# Patient Record
Sex: Male | Born: 1995 | Race: Black or African American | Hispanic: No | Marital: Single
Health system: Midwestern US, Community
[De-identification: ages and names within clinical notes are randomized; demographics above are authoritative.]

---

## 2021-03-02 ENCOUNTER — Encounter: Payer: Self-pay | Admitting: *Deleted

## 2021-03-02 NOTE — Congregational Nurse Program (Signed)
  Dept: [phone]   Congregational Nurse Program Note  Date of Encounter: 03/02/2021  Past Medical History: No past medical history on file.  Encounter Details:  CNP Questionnaire - 03/02/21 0930       Questionnaire   Do you give verbal consent to treat you today? Yes    Visit Setting Church or Organization    Location Patient Served At [facility]    Patient Status Not Applicable    Insurance Uninsured (Includes [employer])    Intervention Support;Refer;Educate    Referrals PCP - other provider             Client came in [facility] requesting a blood pressure check. BP 153/92 Pulse 58. Client reports his diet consists of mostly Ramen Noodles and decreased exercise since being out of school. Client is from [place] and has family there. He does have a brother in the area. Educated client on HTN and better food choices. Referred to Lavinia Sharps NP for a PCP. Discussed Mental Health Services in the area as client reports some depression with no si or hi. Stormy Connon W RN CN [phone]

## 2021-03-05 ENCOUNTER — Encounter: Payer: Self-pay | Admitting: *Deleted
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
## 2021-03-25 ENCOUNTER — Other Ambulatory Visit: Payer: Self-pay

## 2021-03-25 ENCOUNTER — Encounter (HOSPITAL_BASED_OUTPATIENT_CLINIC_OR_DEPARTMENT_OTHER): Payer: Self-pay | Admitting: *Deleted

## 2021-03-25 ENCOUNTER — Emergency Department (HOSPITAL_BASED_OUTPATIENT_CLINIC_OR_DEPARTMENT_OTHER)
Admission: EM | Admit: 2021-03-25 | Discharge: 2021-03-26 | Disposition: A | Discharge status: Home / Self Care | Payer: Self-pay | Attending: Emergency Medicine | Admitting: Emergency Medicine

## 2021-03-25 ENCOUNTER — Emergency Department (HOSPITAL_BASED_OUTPATIENT_CLINIC_OR_DEPARTMENT_OTHER): Payer: Self-pay

## 2021-03-25 DIAGNOSIS — F17210 Nicotine dependence, cigarettes, uncomplicated: Secondary | ICD-10-CM | POA: Insufficient documentation

## 2021-03-25 DIAGNOSIS — M545 Low back pain, unspecified: Secondary | ICD-10-CM | POA: Insufficient documentation

## 2021-03-25 DIAGNOSIS — W010XXA Fall on same level from slipping, tripping and stumbling without subsequent striking against object, initial encounter: Secondary | ICD-10-CM | POA: Insufficient documentation

## 2021-03-25 DIAGNOSIS — W19XXXA Unspecified fall, initial encounter: Secondary | ICD-10-CM

## 2021-03-25 MED ORDER — NAPROXEN 250 MG PO TABS
375.0000 mg | ORAL_TABLET | Freq: Once | ORAL | Status: AC
  Administered 2021-03-25: 375 mg via ORAL
  Filled 2021-03-25: qty 2

## 2021-03-25 MED ORDER — ACETAMINOPHEN 500 MG PO TABS
1000.0000 mg | ORAL_TABLET | Freq: Once | ORAL | Status: AC
  Administered 2021-03-25: 1000 mg via ORAL
  Filled 2021-03-25: qty 2

## 2021-03-25 NOTE — ED Triage Notes (Signed)
Pt brought in by EMS from [facility]. Reports  a fall. Ambulatory on scene

## 2021-03-25 NOTE — ED Provider Notes (Signed)
[facility] EMERGENCY DEPARTMENT Provider Note  CSN: [id number] Arrival date & time: 03/25/21 2250  Chief Complaint(s) Fall  HPI Darrell Kelly is a 25 y.o. male here for lower back pain following a mechanical fall at 10 PM this evening.  Patient reports slipping on wet ground causing him to fall onto his buttocks.  Patient had immediate back pain located in the paraspinal musculature in the lumbar region.  Pain worse with movement, twisting and palpation of this region.  Alleviated by mobility.  Patient has been ambulatory since the incident.  Denies any lower extremity weakness or loss of sensation.  No bladder/bowel incontinence.  No other injuries related to the fall.   Fall   Past Medical History History reviewed. No pertinent past medical history. There are no problems to display for this patient.  Home Medication(s) Prior to Admission medications   Medication Sig Start Date End Date Taking? Authorizing Provider  cyclobenzaprine (FLEXERIL) 10 MG tablet Take 1 tablet (10 mg total) by mouth at bedtime for 10 days. 03/26/21 04/05/21 Yes Josaiah Muhammed, Amadeo Garnet, MD  naproxen (NAPROSYN) 375 MG tablet Take 1 tablet (375 mg total) by mouth 2 (two) times daily. 03/26/21  Yes Zayvon Alicea, Amadeo Garnet, MD                                                                                                                                    Past Surgical History History reviewed. No pertinent surgical history. Family History No family history on file.  Social History Social History   Tobacco Use   Smoking status: Some Days    Pack years: 0.00    Types: Cigarettes    Passive exposure: Never   Smokeless tobacco: Never  Vaping Use   Vaping Use: Every day  Substance Use Topics   Alcohol use: Never   Drug use: Never   Allergies Patient has no known allergies.  Review of Systems Review of Systems All other systems are reviewed and are negative for acute change except as noted in the  HPI  Physical Exam Vital Signs  I have reviewed the triage vital signs BP (!) 146/84 (BP Location: Right Arm)   Pulse 60   Temp 98.4 F (36.9 C) (Oral)   Resp 20   Ht 5\' 9"  (1.753 m)   Wt 96.3 kg   SpO2 99%   BMI 31.35 kg/m   Physical Exam Vitals reviewed.  Constitutional:      General: He is not in acute distress.    Appearance: He is well-developed. He is not diaphoretic.  HENT:     Head: Normocephalic and atraumatic.     Right Ear: External ear normal.     Left Ear: External ear normal.     Nose: Nose normal.     Mouth/Throat:     Mouth: Mucous membranes are moist.  Eyes:     General: No scleral icterus.  Conjunctiva/sclera: Conjunctivae normal.  Neck:     Trachea: Phonation normal.  Cardiovascular:     Rate and Rhythm: Normal rate and regular rhythm.  Pulmonary:     Effort: Pulmonary effort is normal. No respiratory distress.     Breath sounds: No stridor.  Abdominal:     General: There is no distension.  Musculoskeletal:        General: Normal range of motion.     Cervical back: Normal range of motion. No tenderness or bony tenderness.     Thoracic back: No tenderness or bony tenderness.     Lumbar back: Tenderness and bony tenderness present.       Back:  Neurological:     Mental Status: He is alert and oriented to person, place, and time.     Comments: Spine Exam: Strength: 5/5 throughout LE bilaterally (hip flexion/extension, adduction/abduction; knee flexion/extension; foot dorsiflexion/plantarflexion, inversion/eversion; great toe inversion) Sensation: Intact to light touch in proximal and distal LE bilaterally Reflexes: 1+ quadriceps and achilles reflexes   Psychiatric:        Behavior: Behavior normal.    ED Results and Treatments Labs (all labs ordered are listed, but only abnormal results are displayed) Labs Reviewed - No data to display                                                                                                                        EKG  EKG Interpretation  Date/Time:    Ventricular Rate:    PR Interval:    QRS Duration:   QT Interval:    QTC Calculation:   R Axis:     Text Interpretation:          Radiology DG Lumbar Spine 2-3 Views  Result Date: 03/26/2021 CLINICAL DATA:  Fall with low back pain EXAM: LUMBAR SPINE - 2-3 VIEW COMPARISON:  None. FINDINGS: There is no evidence of lumbar spine fracture. Alignment is normal. Intervertebral disc spaces are maintained. IMPRESSION: Negative. Electronically Signed   By: Deatra Robinson M.D.   On: 03/26/2021 00:43    Pertinent labs & imaging results that were available during my care of the patient were reviewed by me and considered in my medical decision making (see chart for details).  Medications Ordered in ED Medications  acetaminophen (TYLENOL) tablet 1,000 mg (1,000 mg Oral Given 03/25/21 2343)  naproxen (NAPROSYN) tablet 375 mg (375 mg Oral Given 03/25/21 2343)  Procedures Procedures  (including critical care time)  Medical Decision Making / ED Course I have reviewed the nursing notes for this encounter and the patient's prior records (if available in EHR or on provided paperwork).   Darrell Kelly was evaluated in Emergency Department on 03/26/2021 for the symptoms described in the history of present illness. He was evaluated in the context of the global COVID-19 pandemic, which necessitated consideration that the patient might be at risk for infection with the SARS-CoV-2 virus that causes COVID-19. Institutional protocols and algorithms that pertain to the evaluation of patients at risk for COVID-19 are in a state of rapid change based on information released by regulatory bodies including the CDC and federal and state organizations. These policies and algorithms were followed during the patient's care in the ED.  25 y.o.  male presents with back pain in lumbar area for 2 hours without signs of radicular pain following mechanical fall. No red flag symptoms of  saddle anesthesia, weakness, fecal/urinary incontinence or urinary retention.   Plain film negative for fracture or malalignement.  Patient was recommended to take short course of scheduled NSAIDs and engage in early mobility as definitive treatment. Return precautions discussed for worsening or new concerning symptoms.      Final Clinical Impression(s) / ED Diagnoses Final diagnoses:  Fall, initial encounter  Lumbar back pain   The patient appears reasonably screened and/or stabilized for discharge and I doubt any other medical condition or other [facility] requiring further screening, evaluation, or treatment in the ED at this time prior to discharge. Safe for discharge with strict return precautions.  Disposition: Discharge  Condition: Good  I have discussed the results, Dx and Tx plan with the patient/family who expressed understanding and agree(s) with the plan. Discharge instructions discussed at length. The patient/family was given strict return precautions who verbalized understanding of the instructions. No further questions at time of discharge.    ED Discharge Orders          Ordered    naproxen (NAPROSYN) 375 MG tablet  2 times daily        03/26/21 0012    cyclobenzaprine (FLEXERIL) 10 MG tablet  Daily at bedtime        03/26/21 0012             Follow Up: Primary care provider  Call  to schedule an appointment for close follow up     This chart was dictated using voice recognition software.  Despite best efforts to proofread,  errors can occur which can change the documentation meaning.    Nira Conn, MD 03/26/21 [phone]

## 2021-03-26 MED ORDER — CYCLOBENZAPRINE HCL 10 MG PO TABS: 10.0000 mg | ORAL_TABLET | Freq: Every day | ORAL | 0 refills | Status: DC

## 2021-03-26 MED ORDER — NAPROXEN 375 MG PO TABS: 375.0000 mg | ORAL_TABLET | Freq: Two times a day (BID) | ORAL | 0 refills | Status: DC

## 2021-03-26 NOTE — Discharge Instructions (Addendum)
You may use over-the-counter Motrin (Ibuprofen), Acetaminophen (Tylenol), topical muscle creams such as SalonPas, Icy Hot, Bengay, etc. Please stretch, apply ice or heat (whichever helps), and have massage therapy for additional assistance.  

## 2021-03-28 ENCOUNTER — Emergency Department (HOSPITAL_COMMUNITY)
Admission: EM | Admit: 2021-03-28 | Discharge: 2021-03-28 | Disposition: A | Discharge status: Home / Self Care | Payer: Self-pay | Attending: Emergency Medicine | Admitting: Emergency Medicine

## 2021-03-28 ENCOUNTER — Emergency Department (HOSPITAL_COMMUNITY): Payer: Self-pay

## 2021-03-28 ENCOUNTER — Encounter (HOSPITAL_COMMUNITY): Payer: Self-pay | Admitting: Emergency Medicine

## 2021-03-28 ENCOUNTER — Other Ambulatory Visit: Payer: Self-pay

## 2021-03-28 DIAGNOSIS — M542 Cervicalgia: Secondary | ICD-10-CM | POA: Insufficient documentation

## 2021-03-28 DIAGNOSIS — F17210 Nicotine dependence, cigarettes, uncomplicated: Secondary | ICD-10-CM | POA: Insufficient documentation

## 2021-03-28 MED ORDER — METHOCARBAMOL 500 MG PO TABS: 500.0000 mg | ORAL_TABLET | Freq: Two times a day (BID) | ORAL | 0 refills | Status: DC
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
## 2021-03-28 NOTE — Discharge Instructions (Addendum)
You will need to follow up with spine specialist, their contact information is attached to your chart.   You may take NSAIDS to help with pain control but please make sure to take these with food.   If you experience any changes in vision, headaches, you will need to return to the ED.

## 2021-03-28 NOTE — ED Notes (Signed)
Patient called for room placement x1 with no answer. 

## 2021-03-30 ENCOUNTER — Emergency Department (HOSPITAL_COMMUNITY)
Admission: EM | Admit: 2021-03-30 | Discharge: 2021-03-30 | Disposition: A | Discharge status: Home / Self Care | Payer: Self-pay | Attending: Emergency Medicine | Admitting: Emergency Medicine

## 2021-03-30 ENCOUNTER — Encounter (HOSPITAL_COMMUNITY): Payer: Self-pay | Admitting: Emergency Medicine

## 2021-03-30 ENCOUNTER — Other Ambulatory Visit: Payer: Self-pay

## 2021-03-30 DIAGNOSIS — F17210 Nicotine dependence, cigarettes, uncomplicated: Secondary | ICD-10-CM | POA: Insufficient documentation

## 2021-03-30 DIAGNOSIS — M542 Cervicalgia: Secondary | ICD-10-CM | POA: Insufficient documentation

## 2021-03-30 MED ORDER — NAPROXEN 500 MG PO TABS: 500.0000 mg | ORAL_TABLET | Freq: Two times a day (BID) | ORAL | 0 refills | Status: AC

## 2021-03-30 MED ORDER — METHOCARBAMOL 500 MG PO TABS: 500.0000 mg | ORAL_TABLET | Freq: Two times a day (BID) | ORAL | 0 refills | Status: DC
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
## 2021-03-30 NOTE — ED Triage Notes (Addendum)
Pt c/o neck pain x 1.5 years. Vitals stable. Ambulatory in triage.

## 2021-03-30 NOTE — Discharge Instructions (Addendum)

## 2021-04-15 ENCOUNTER — Other Ambulatory Visit: Payer: Self-pay

## 2021-04-15 DIAGNOSIS — F17210 Nicotine dependence, cigarettes, uncomplicated: Secondary | ICD-10-CM | POA: Insufficient documentation

## 2021-04-15 DIAGNOSIS — M25512 Pain in left shoulder: Secondary | ICD-10-CM | POA: Insufficient documentation

## 2021-04-15 DIAGNOSIS — W19XXXA Unspecified fall, initial encounter: Secondary | ICD-10-CM | POA: Insufficient documentation

## 2021-04-16 ENCOUNTER — Emergency Department (HOSPITAL_BASED_OUTPATIENT_CLINIC_OR_DEPARTMENT_OTHER)
Admission: EM | Admit: 2021-04-16 | Discharge: 2021-04-16 | Disposition: A | Discharge status: Home / Self Care | Payer: Self-pay | Attending: Emergency Medicine | Admitting: Emergency Medicine

## 2021-04-16 ENCOUNTER — Emergency Department (HOSPITAL_BASED_OUTPATIENT_CLINIC_OR_DEPARTMENT_OTHER): Payer: Self-pay | Admitting: Radiology

## 2021-04-16 DIAGNOSIS — M25512 Pain in left shoulder: Secondary | ICD-10-CM

## 2021-04-16 MED ORDER — KETOROLAC TROMETHAMINE 30 MG/ML IJ SOLN
30.0000 mg | Freq: Once | INTRAMUSCULAR | Status: AC
  Administered 2021-04-16: 30 mg via INTRAMUSCULAR
  Filled 2021-04-16: qty 1

## 2021-04-16 MED ORDER — IBUPROFEN 600 MG PO TABS: 600.0000 mg | ORAL_TABLET | Freq: Four times a day (QID) | ORAL | 0 refills | Status: DC | PRN

## 2021-04-16 NOTE — ED Provider Notes (Signed)
[facility] EMERGENCY DEPT Provider Note   CSN: [id number] Arrival date & time: 04/15/21  2359     History Chief Complaint  Patient presents with   Shoulder Pain    Darrell Kelly is a 25 y.o. male.  HPI     This is a 25 year old male brought in by EMS with left shoulder pain.  Patient reports that he fell approximately 1 hour prior to arrival.  He reports tripping and falling.  He reports that he landed on his left side hurting his left shoulder.  He rates his pain at 7 out of 10.  He did not take anything for the pain.  Pain is worse with range of motion.  Denies hitting his head or loss of consciousness.  Denies other injury.  No past medical history on file.  There are no problems to display for this patient.   No past surgical history on file.     No family history on file.  Social History   Tobacco Use   Smoking status: Some Days    Types: Cigarettes    Passive exposure: Never   Smokeless tobacco: Never  Vaping Use   Vaping Use: Every day  Substance Use Topics   Alcohol use: Never   Drug use: Never    Home Medications Prior to Admission medications   Medication Sig Start Date End Date Taking? Authorizing Provider  ibuprofen (ADVIL) 600 MG tablet Take 1 tablet (600 mg total) by mouth every 6 (six) hours as needed. 04/16/21  Yes Antonia Jicha, Mayer Masker, MD  methocarbamol (ROBAXIN) 500 MG tablet Take 1 tablet (500 mg total) by mouth 2 (two) times daily. 03/30/21   Couture, Cortni S, PA-C    Allergies    Patient has no known allergies.  Review of Systems   Review of Systems  Musculoskeletal:        Shoulder pain  Neurological:  Negative for weakness and numbness.  All other systems reviewed and are negative.  Physical Exam Updated Vital Signs BP 126/83 (BP Location: Right Arm)   Pulse 72   Temp 98.8 F (37.1 C) (Oral)   Resp 18   Ht 1.702 m (5\' 7" )   Wt 90.7 kg   SpO2 100%   BMI 31.32 kg/m   Physical Exam Vitals and nursing note  reviewed.  Constitutional:      Appearance: He is well-developed. He is not ill-appearing.  HENT:     Head: Normocephalic and atraumatic.     Nose: Nose normal.     Mouth/Throat:     Mouth: Mucous membranes are moist.  Eyes:     Pupils: Pupils are equal, round, and reactive to light.  Cardiovascular:     Rate and Rhythm: Normal rate and regular rhythm.     Heart sounds: Normal heart sounds. No murmur heard. Pulmonary:     Effort: Pulmonary effort is normal. No respiratory distress.     Breath sounds: Normal breath sounds. No wheezing.  Abdominal:     General: Bowel sounds are normal.     Palpations: Abdomen is soft.     Tenderness: There is no abdominal tenderness. There is no rebound.  Musculoskeletal:     Cervical back: Normal range of motion.     Comments: No obvious deformity to the left shoulder, there is tenderness to palpation over the left AC joint, no obvious dislocation, normal range of motion, 2+ radial pulse, neurovascular intact distally  Skin:    General: Skin is warm and dry.  Neurological:     Mental Status: He is alert and oriented to person, place, and time.  Psychiatric:        Mood and Affect: Mood normal.    ED Results / Procedures / Treatments   Labs (all labs ordered are listed, but only abnormal results are displayed) Labs Reviewed - No data to display  EKG None  Radiology DG Shoulder Left  Result Date: 04/16/2021 CLINICAL DATA:  Status post fall with subsequent left shoulder pain. EXAM: LEFT SHOULDER - 2+ VIEW COMPARISON:  None. FINDINGS: While no gross fracture deformity is identified, the border of the scapula along inferior aspect of the left glenoid is indistinct in appearance. There is no evidence of dislocation. There is no evidence of arthropathy. A 7 mm benign-appearing bone island is noted along the inferior aspect of the left glenoid. Soft tissues are unremarkable. IMPRESSION: Indistinct border of a portion of the left scapula, as described  above, which may be chronic in nature. Correlate with physical examination to determine the presence of point tenderness within this region. Additional evaluation with CT is recommended if an acute fracture remains of clinical concern. Electronically Signed   By: Aram Candela M.D.   On: 04/16/2021 02:21    Procedures Procedures   Medications Ordered in ED Medications  ketorolac (TORADOL) 30 MG/ML injection 30 mg (30 mg Intramuscular Given 04/16/21 0207)    ED Course  I have reviewed the triage vital signs and the nursing notes.  Pertinent labs & imaging results that were available during my care of the patient were reviewed by me and considered in my medical decision making (see chart for details).    MDM Rules/Calculators/A&P                           Patient presents with left shoulder pain after a fall.  He is overall nontoxic and vital signs are reassuring.  Exam is not consistent with dislocation or fracture.  He has some AC joint pain which may indicate slight separation.  X-rays are largely reassuring.  There is an indistinct border of the lower portion of the left scapula; however, his symptoms do not coordinate with pain in this region.  Do not feel he needs CT imaging.  Recommend ibuprofen for pain.  After history, exam, and medical workup I feel the patient has been appropriately medically screened and is safe for discharge home. Pertinent diagnoses were discussed with the patient. Patient was given return precautions.  Final Clinical Impression(s) / ED Diagnoses Final diagnoses:  Acute pain of left shoulder    Rx / DC Orders ED Discharge Orders          Ordered    ibuprofen (ADVIL) 600 MG tablet  Every 6 hours PRN        04/16/21 0234             Shon Baton, MD 04/16/21 [phone]

## 2021-04-16 NOTE — Discharge Instructions (Addendum)
You were seen today for shoulder pain.  Your x-rays do not show any fracture or dislocation.  Take ibuprofen as needed for pain.

## 2021-04-16 NOTE — ED Triage Notes (Signed)
Pt to ED with c/o left shoulder pain x1 hour after pt tripped and fell.

## 2021-05-24 ENCOUNTER — Other Ambulatory Visit: Payer: Self-pay

## 2021-05-24 ENCOUNTER — Emergency Department (HOSPITAL_COMMUNITY): Admission: EM | Admit: 2021-05-24 | Discharge: 2021-05-24 | Discharge status: Absent without leave | Payer: Self-pay

## 2021-05-24 NOTE — ED Notes (Signed)
Pt states he is here to get results of his xray. Directed pt to go to medical records in the morning. Pt does not have any medical complaints at this time.

## 2021-05-27 ENCOUNTER — Encounter (HOSPITAL_COMMUNITY): Payer: Self-pay | Admitting: *Deleted

## 2021-05-27 ENCOUNTER — Other Ambulatory Visit: Payer: Self-pay

## 2021-05-27 ENCOUNTER — Emergency Department (HOSPITAL_COMMUNITY): Payer: Self-pay

## 2021-05-27 ENCOUNTER — Emergency Department (HOSPITAL_COMMUNITY)
Admission: EM | Admit: 2021-05-27 | Discharge: 2021-05-28 | Disposition: A | Discharge status: Home / Self Care | Payer: Self-pay | Attending: Emergency Medicine | Admitting: Emergency Medicine

## 2021-05-27 DIAGNOSIS — F17210 Nicotine dependence, cigarettes, uncomplicated: Secondary | ICD-10-CM | POA: Insufficient documentation

## 2021-05-27 DIAGNOSIS — G8929 Other chronic pain: Secondary | ICD-10-CM | POA: Insufficient documentation

## 2021-05-27 DIAGNOSIS — M25511 Pain in right shoulder: Secondary | ICD-10-CM | POA: Insufficient documentation

## 2021-05-27 MED ORDER — METHOCARBAMOL 500 MG PO TABS: 500.0000 mg | ORAL_TABLET | Freq: Two times a day (BID) | ORAL | 0 refills | Status: AC

## 2021-05-27 MED ORDER — IBUPROFEN 600 MG PO TABS: 600.0000 mg | ORAL_TABLET | Freq: Four times a day (QID) | ORAL | 0 refills | Status: AC | PRN
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
## 2021-05-27 NOTE — Discharge Instructions (Addendum)
X-ray of your right shoulder did not show any concerning finding.  Please call and follow-up closely with an orthopedic specialist for further managements of your recurrent shoulder pain.  You may take ibuprofen and muscle relaxant as needed for your discomfort.

## 2021-05-27 NOTE — ED Triage Notes (Signed)
The pt reports that he has had a a shoulder problem for months  his rt shoulder is the problem for the past 2 months  he wants an xray and a plan of care

## 2021-05-28 ENCOUNTER — Emergency Department (HOSPITAL_COMMUNITY)
Admission: EM | Admit: 2021-05-28 | Discharge: 2021-05-28 | Disposition: A | Discharge status: Home / Self Care | Payer: Self-pay | Attending: Emergency Medicine | Admitting: Emergency Medicine

## 2021-05-28 ENCOUNTER — Emergency Department (HOSPITAL_COMMUNITY): Payer: Self-pay

## 2021-05-28 DIAGNOSIS — M25511 Pain in right shoulder: Secondary | ICD-10-CM | POA: Insufficient documentation

## 2021-05-28 DIAGNOSIS — M25512 Pain in left shoulder: Secondary | ICD-10-CM | POA: Insufficient documentation

## 2021-05-28 DIAGNOSIS — F17210 Nicotine dependence, cigarettes, uncomplicated: Secondary | ICD-10-CM | POA: Insufficient documentation

## 2021-05-28 NOTE — ED Provider Notes (Signed)
Emergency Medicine Provider Triage Evaluation Note  Darrell Kelly , a 25 y.o. male  was evaluated in triage.  Pt complains of left shoulder pain.  He was seen here yesterday for right shoulder pain.  He reports that he was doing push-ups this morning and felt pain in his left shoulder.  He denies any other injuries.  He is requesting an MRI.  Review of Systems  Positive: Shoulder pain Negative: Syncope  Physical Exam  BP 127/87 (BP Location: Right Arm)   Pulse (!) 57   Temp 98.9 F (37.2 C) (Oral)   Resp 14   Ht 5\' 9"  (1.753 m)   Wt 90.7 kg   SpO2 100%   BMI 29.53 kg/m  Gen:   Awake, no distress   Resp:  Normal effort  MSK:   Except for reported pain with attempts at moving bilateral shoulders. Other:  Normal gait.   Medical Decision Making  Medically screening exam initiated at 6:04 PM.  Appropriate orders placed.  Jalal Rauch was informed that the remainder of the evaluation will be completed by another provider, this initial triage assessment does not replace that evaluation, and the importance of remaining in the ED until their evaluation is complete.  Patient is here for evaluation of shoulder pain.  He was seen yesterday for pain in the opposite shoulder and and then had worsening pain today after he did push-ups.  I explained to patient that an MRI would typically be ordered by orthopedics.  We will obtain x-ray given he reports worsening pain.  Note: Portions of this report may have been transcribed using voice recognition software. Every effort was made to ensure accuracy; however, inadvertent computerized transcription errors may be present    Randa Spike 05/28/21 07/28/21, MD 05/29/21 2123

## 2021-05-28 NOTE — ED Provider Notes (Addendum)
[facility] EMERGENCY DEPARTMENT Provider Note   CSN: [id number] Arrival date & time: 05/28/21  1742     History Chief Complaint  Patient presents with   Shoulder Pain    Darrell Kelly is a 25 y.o. male presenting with shoulder pain. Patient complains of bilateral shoulder pain, L>R. Reports it has been present for 2 months. Was seen here yesterday for his right shoulder pain, at which time x-ray was negative. He was discharged home with NSAIDs and Robaxin and advised to follow up with an orthopedist.  Today he returns because his left shoulder is now hurting more. States he was doing pushups and felt like it came out of place. Reports it's back in place now. No numbness, tingling, or weakness.    No past medical history on file.  There are no problems to display for this patient.   No past surgical history on file.     No family history on file.  Social History   Tobacco Use   Smoking status: Some Days    Types: Cigarettes    Passive exposure: Never   Smokeless tobacco: Never  Vaping Use   Vaping Use: Every day  Substance Use Topics   Alcohol use: Never   Drug use: Never    Home Medications Prior to Admission medications   Medication Sig Start Date End Date Taking? Authorizing Provider  ibuprofen (ADVIL) 600 MG tablet Take 1 tablet (600 mg total) by mouth every 6 (six) hours as needed for moderate pain. 05/27/21   Fayrene Helper, PA-C  methocarbamol (ROBAXIN) 500 MG tablet Take 1 tablet (500 mg total) by mouth 2 (two) times daily. 05/27/21   Fayrene Helper, PA-C    Allergies    Patient has no known allergies.  Review of Systems   Review of Systems 10 systems reviewed and were negative aside from HPI.  Physical Exam Updated Vital Signs BP 127/87 (BP Location: Right Arm)   Pulse (!) 57   Temp 98.9 F (37.2 C) (Oral)   Resp 14   Ht 5\' 9"  (1.753 m)   Wt 90.7 kg   SpO2 100%   BMI 29.53 kg/m   Physical Exam Constitutional:      General: He is  not in acute distress.    Appearance: Normal appearance.  HENT:     Head: Atraumatic.  Cardiovascular:     Rate and Rhythm: Normal rate and regular rhythm.     Heart sounds: Normal heart sounds.  Pulmonary:     Effort: Pulmonary effort is normal.  Musculoskeletal:     Cervical back: Normal range of motion.     Comments: Full ROM of bilateral shoulders. No swelling or deformity noted. No tenderness or crepitus over clavicles. Mild tenderness over anterior glenohumeral joint on left. 5/5 strength with resisted abduction. Negative empty can sign.    Skin:    General: Skin is warm.     Capillary Refill: Capillary refill takes less than 2 seconds.  Neurological:     General: No focal deficit present.     Mental Status: He is alert.     Sensory: No sensory deficit.     Motor: No weakness.    ED Results / Procedures / Treatments   Labs (all labs ordered are listed, but only abnormal results are displayed) Labs Reviewed - No data to display  EKG None  Radiology DG Shoulder Right  Result Date: 05/27/2021 CLINICAL DATA:  Recurrent pain EXAM: RIGHT SHOULDER - 2+ VIEW COMPARISON:  None. FINDINGS: There is no evidence of fracture or dislocation. There is no evidence of arthropathy or other focal bone abnormality. Soft tissues are unremarkable. IMPRESSION: Negative. Electronically Signed   By: Charlett Nose M.D.   On: 05/27/2021 23:29   DG Shoulder Left  Result Date: 05/28/2021 CLINICAL DATA:  Shoulder pain EXAM: LEFT SHOULDER - 2+ VIEW COMPARISON:  04/16/2021, 05/27/2021 FINDINGS: There is no evidence of fracture or dislocation. There is no evidence of arthropathy or other focal bone abnormality. Soft tissues are unremarkable. IMPRESSION: Negative. Electronically Signed   By: Jasmine Pang M.D.   On: 05/28/2021 18:32    Procedures Procedures   Medications Ordered in ED Medications - No data to display  ED Course  I have reviewed the triage vital signs and the nursing  notes.  Pertinent labs & imaging results that were available during my care of the patient were reviewed by me and considered in my medical decision making (see chart for details).    MDM Rules/Calculators/A&P                         Otherwise healthy 25 year old male presents with bilateral shoulder pain x2 months. Reports he was doing pushups today and it felt like his L shoulder came out of place. Of note, he was evaluated here yesterday for R shoulder pain and had normal x-ray.  Exam is very benign with full ROM of bilateral shoulders, 5/5 strength, and no deformity. Neurovascularly intact. Mild tenderness over anterior L shoulder.   Suspect rotator cuff strain. Less likely acute fracture or dislocation. Will obtain x-ray.  L shoulder x-ray normal. Results discussed with patient. Stable for discharge home. Instructed to continue NSAIDs/robaxin as needed and follow-up with PCP.    Final Clinical Impression(s) / ED Diagnoses Final diagnoses:  Bilateral shoulder pain, unspecified chronicity    Rx / DC Orders ED Discharge Orders     None      Maury Dus, MD PGY-2 [facility] Family Medicine   Maury Dus, MD 05/28/21 2119    Maury Dus, MD 05/28/21 7322    Pricilla Loveless, MD 05/29/21 0104

## 2021-05-28 NOTE — Discharge Instructions (Addendum)
You were seen in the Emergency Department for shoulder pain. Your x-ray was normal. You can take Ibuprofen as needed for your pain.  It is important to find a primary care doctor to follow-up with. They may recommend physical therapy if your shoulder pain is not improving over time.

## 2021-05-28 NOTE — ED Triage Notes (Signed)
Pt BIBA for left shoulder pain. Pt states that arm feels like ligaments are stretched. Pt has no c/o numbness of tingling. Symptoms started after push ups, but the pain has been persistent since June. Pain is equal in both shoulders.

## 2021-05-29 ENCOUNTER — Emergency Department (HOSPITAL_COMMUNITY)
Admission: EM | Admit: 2021-05-29 | Discharge: 2021-05-29 | Disposition: A | Discharge status: Home / Self Care | Payer: Self-pay | Attending: Emergency Medicine | Admitting: Emergency Medicine

## 2021-05-29 ENCOUNTER — Other Ambulatory Visit: Payer: Self-pay

## 2021-05-29 DIAGNOSIS — F17210 Nicotine dependence, cigarettes, uncomplicated: Secondary | ICD-10-CM | POA: Insufficient documentation

## 2021-05-29 DIAGNOSIS — M25512 Pain in left shoulder: Secondary | ICD-10-CM | POA: Insufficient documentation

## 2021-05-29 NOTE — ED Notes (Signed)
Called ortho tech for shoulder immobilizer/ sling

## 2021-05-29 NOTE — ED Provider Notes (Signed)
[facility] EMERGENCY DEPARTMENT Provider Note  CSN: [id number] Arrival date & time: 05/29/21 1540  Chief Complaint(s) Shoulder Injury  HPI Darrell Kelly is a 25 y.o. male seen yesterday evening for shoulder plain. History of intermittent sporadic shoulder dislocation. Believes he dislocated shoulder while doing push-ups. On assessment earlier in the evening plain films are negative. Returns tonight to get a sling. Denies any acute injuries or changes   The history is provided by the patient.   Past Medical History No past medical history on file. There are no problems to display for this patient.  Home Medication(s) Prior to Admission medications   Medication Sig Start Date End Date Taking? Authorizing Provider  ibuprofen (ADVIL) 600 MG tablet Take 1 tablet (600 mg total) by mouth every 6 (six) hours as needed for moderate pain. 05/27/21   Fayrene Helper, PA-C  methocarbamol (ROBAXIN) 500 MG tablet Take 1 tablet (500 mg total) by mouth 2 (two) times daily. 05/27/21   Fayrene Helper, PA-C                                                                                                                                    Past Surgical History No past surgical history on file. Family History No family history on file.  Social History Social History   Tobacco Use   Smoking status: Some Days    Types: Cigarettes    Passive exposure: Never   Smokeless tobacco: Never  Vaping Use   Vaping Use: Every day  Substance Use Topics   Alcohol use: Never   Drug use: Never   Allergies Patient has no known allergies.  Review of Systems Review of Systems All other systems are reviewed and are negative for acute change except as noted in the HPI  Physical Exam Vital Signs  I have reviewed the triage vital signs BP 135/87 (BP Location: Left Arm)   Pulse (!) 52   Temp 98.1 F (36.7 C) (Oral)   Resp 17   Ht 5\' 9"  (1.753 m)   Wt 90.7 kg   SpO2 99%   BMI 29.53 kg/m    Physical Exam Vitals reviewed.  Constitutional:      General: He is not in acute distress.    Appearance: He is well-developed. He is not diaphoretic.  HENT:     Head: Normocephalic and atraumatic.     Right Ear: External ear normal.     Left Ear: External ear normal.     Nose: Nose normal.     Mouth/Throat:     Mouth: Mucous membranes are moist.  Eyes:     General: No scleral icterus.    Conjunctiva/sclera: Conjunctivae normal.  Neck:     Trachea: Phonation normal.  Cardiovascular:     Rate and Rhythm: Normal rate and regular rhythm.  Pulmonary:     Effort: Pulmonary effort is normal. No respiratory distress.  Breath sounds: No stridor.  Abdominal:     General: There is no distension.  Musculoskeletal:        General: Normal range of motion.     Right shoulder: No swelling, deformity, tenderness or bony tenderness. Normal range of motion. Normal strength. Normal pulse.     Left shoulder: No swelling, deformity, tenderness or bony tenderness. Normal range of motion. Normal strength. Normal pulse.     Cervical back: Normal range of motion.  Neurological:     Mental Status: He is alert and oriented to person, place, and time.  Psychiatric:        Behavior: Behavior normal.    ED Results and Treatments Labs (all labs ordered are listed, but only abnormal results are displayed) Labs Reviewed - No data to display                                                                                                                       EKG  EKG Interpretation  Date/Time:    Ventricular Rate:    PR Interval:    QRS Duration:   QT Interval:    QTC Calculation:   R Axis:     Text Interpretation:         Radiology DG Shoulder Left  Result Date: 05/28/2021 CLINICAL DATA:  Shoulder pain EXAM: LEFT SHOULDER - 2+ VIEW COMPARISON:  04/16/2021, 05/27/2021 FINDINGS: There is no evidence of fracture or dislocation. There is no evidence of arthropathy or other focal bone  abnormality. Soft tissues are unremarkable. IMPRESSION: Negative. Electronically Signed   By: Jasmine Pang M.D.   On: 05/28/2021 18:32    Pertinent labs & imaging results that were available during my care of the patient were reviewed by me and considered in my medical decision making (see MDM for details).  Medications Ordered in ED Medications - No data to display                                                                                                                                   Procedures Procedures  (including critical care time)  Medical Decision Making / ED Course I have reviewed the nursing notes for this encounter and the patient's prior records (if available in EHR or on provided paperwork).  Darrell Kelly was evaluated in Emergency Department on 05/29/2021 for the symptoms described in the history of present illness. He was evaluated in  the context of the global COVID-19 pandemic, which necessitated consideration that the patient might be at risk for infection with the SARS-CoV-2 virus that causes COVID-19. Institutional protocols and algorithms that pertain to the evaluation of patients at risk for COVID-19 are in a state of rapid change based on information released by regulatory bodies including the CDC and federal and state organizations. These policies and algorithms were followed during the patient's care in the ED.     No need for repeat imaging. Provided with sling. Final Clinical Impression(s) / ED Diagnoses Final diagnoses:  Acute pain of left shoulder   The patient appears reasonably screened and/or stabilized for discharge and I doubt any other medical condition or other [facility] requiring further screening, evaluation, or treatment in the ED at this time prior to discharge. Safe for discharge with strict return precautions.  Disposition: Discharge  Condition: Good  I have discussed the results, Dx and Tx plan with the patient/family who expressed  understanding and agree(s) with the plan. Discharge instructions discussed at length. The patient/family was given strict return precautions who verbalized understanding of the instructions. No further questions at time of discharge.    ED Discharge Orders     None        Follow Up: Orthopedic  Call  to schedule an appointment for close follow up     This chart was dictated using voice recognition software.  Despite best efforts to proofread,  errors can occur which can change the documentation meaning.    Nira Conn, MD 05/29/21 [phone]

## 2021-05-29 NOTE — ED Triage Notes (Addendum)
Pt c/o he injured his shoulders doing push-ups yesterday.

## 2021-05-29 NOTE — ED Notes (Signed)
Patient care taken, shoulder came out of socket earlier and now is back in, wants a sling

## 2021-05-29 NOTE — Progress Notes (Signed)
Orthopedic Tech Progress Note Patient Details:  Darrell Kelly [birth year] [id number]  Ortho Devices Type of Ortho Device: Sling immobilizer Ortho Device/Splint Location: LUE Ortho Device/Splint Interventions: Ordered, Application, Adjustment   Post Interventions Patient Tolerated: Well Instructions Provided: Care of device  [name] 05/29/2021, 7:34 AM

## 2021-06-19 ENCOUNTER — Emergency Department (HOSPITAL_COMMUNITY): Admission: EM | Admit: 2021-06-19 | Discharge: 2021-06-20 | Discharge status: Against Medical Advice | Payer: Self-pay

## 2021-06-19 NOTE — ED Notes (Signed)
Second call for triage no answer 

## 2021-06-19 NOTE — ED Notes (Signed)
No answer for triage.

## 2021-07-18 ENCOUNTER — Emergency Department: Admit: 2021-07-18

## 2021-07-18 ENCOUNTER — Inpatient Hospital Stay
Admit: 2021-07-18 | Discharge: 2021-07-18 | Disposition: A | Discharge status: Home / Self Care | Attending: Emergency Medicine

## 2021-07-18 DIAGNOSIS — S46912A Strain of unspecified muscle, fascia and tendon at shoulder and upper arm level, left arm, initial encounter: Secondary | ICD-10-CM

## 2021-07-18 MED ORDER — IBUPROFEN 600 MG PO TABS
600 MG | ORAL_TABLET | Freq: Three times a day (TID) | ORAL | 0 refills | Status: AC | PRN
Start: 2021-07-18 — End: ?

## 2021-07-18 MED ORDER — METHOCARBAMOL 750 MG PO TABS
750 MG | ORAL_TABLET | Freq: Three times a day (TID) | ORAL | 0 refills | Status: AC | PRN
Start: 2021-07-18 — End: 2021-07-28

## 2021-07-18 NOTE — ED Provider Notes (Addendum)
TRIAGE CHIEF COMPLAINT:   Chief Complaint   Patient presents with    Shoulder Injury     left         HPI: Mike Collins is a 25 y.o. male who presents to the Emergency Department with complaint of left shoulder pain after falling early this morning.  He states he landed on his left hand and elbow but has pain primarily in the shoulder.  Denies head injury, headache or neck pain.  No loss of consciousness.  Denies numbness or weakness.  He is left handed.  Denies any other injury.  The patient states he lives in [place] and is returning there in about a month.  Review of the old record shows a history of multiple ED visits for bilateral shoulder pain but especially left shoulder pain with previous x-rays done that have been negative.  He states he was referred to an orthopedic doctor but never followed up.      REVIEW OF SYSTEMS:  6 systems reviewed. Pertinent positives per HPI. Otherwise noted to be negative.  Nursing notes reviewed and agree with above. Past medical/surgical history reviewed.    MEDICATIONS   Patient's Medications    No medications on file         ALLERGIES No Known Allergies      BP 132/73    Pulse 57    Temp 98.2 ??F (36.8 ??C) (Oral)    Resp 14    Ht 5\' 8"  (1.727 m)    Wt 200 lb (90.7 kg)    SpO2 100%    BMI 30.41 kg/m??   General:  No acute distress. Non toxic appearance  Head:   Normocephalic and atraumatic  Eyes:   Conjunctiva clear, PERL, EOM's intact.   ENT:   Mucous membranes moist  Neck:   Supple. No adenopathy.  Lungs/Chest:  No respiratory distress  CVS:   Regular rate and rhythm  Extremities:  Examination of the left upper extremity shows some mild soft tissue tenderness over the area of the left clavicle.  He is more tender over the shoulder proper without evidence of deformity.  Range of motion is decreased secondary to pain.  Distal neurovascular exam is intact.  There is no tenderness of the elbow wrist or hand.  Radial pulses are 3+ and equal.  Skin:   No rashes or lesions to  exposed skin  Neuro:  Alert and OX3. Speech clear and appropriate. No extremity weakness.        Normal sensation in all extremities.  No facial asymmetry. Gait normal.  Psych:   Affect normal. Mood normal        RADIOLOGY  XR SHOULDER LEFT (MIN 2 VIEWS)   Final Result   Impression: No acute abnormality.           LAB      ED COURSE / MDM:  25 year old male, left-handed with a fall this morning onto his left hand and elbow presents with complaints of left shoulder pain.  He has tenderness of the shoulder without deformity.  Minimal soft tissue tenderness in the area of the left clavicle.  Distal neurovascular exam is intact.  X-rays of the left shoulder read by the radiologist and reviewed by myself shows no acute abnormality.  Likely the patient has a strain.  He was given a sling.  Recommended rest ice and elevation.  He was given a prescription for ibuprofen to take if needed for pain.  Advise follow-up with primary care  or orthopedics on-call.      I discussed with [name] the results of evaluation in the Emergency Department, diagnosis, care and prognosis. The plan is to discharge to home. The patient is in agreement with the plan and questions have been answered. I also discussed with the patient and/or family the reasons which may require a return visit and the importance of follow-up care.       (Please note that portions of this note may have been completed with a voice recognition program.  Efforts were made to edit the dictation but occasionally words are mis-transcribed)        FINAL IMPRESSION:  1 --left shoulder strain     Donney Dice, MD  07/18/21 [id number]       Donney Dice, MD  07/18/21 [phone]

## 2021-07-18 NOTE — ED Notes (Signed)
Patient to ed per [facility] for complaints of left shoulder pain after a fall today while walking.     Tempie Donning, RN  07/18/21 0700

## 2021-07-18 NOTE — ED Notes (Signed)
Patient given prescription, discharge instructions verbal and written, patient verbalized understanding.  Alert/oriented X4, Clear speech.  Patient exhibits no distress, ambulates with steady gait per self leaving unit, no further request.      Tempie Donning, RN  07/18/21 [phone]

## 2021-07-18 NOTE — Discharge Instructions (Signed)
Rest and elevate your left arm.  Use ice on the area of soreness for the next 5 to 7 days if needed for pain.  Take the ibuprofen 3 times daily if needed for pain.  Take the Robaxin muscle relaxant 3 times daily if needed for muscle spasm or tightness.  Follow-up with your primary care doctor upon return home to [facility].  Follow-up with the on-call orthopedic doctor here if symptoms persist or worsen.

## 2022-12-09 IMAGING — DX DG SHOULDER 2+V*L*
3 series · 3 of 3 positions shown · non-contrast
Comparison: 04/16/2021, 05/27/2021

CLINICAL DATA: Shoulder pain

EXAM:
LEFT SHOULDER - 2+ VIEW

[shoulder y view]
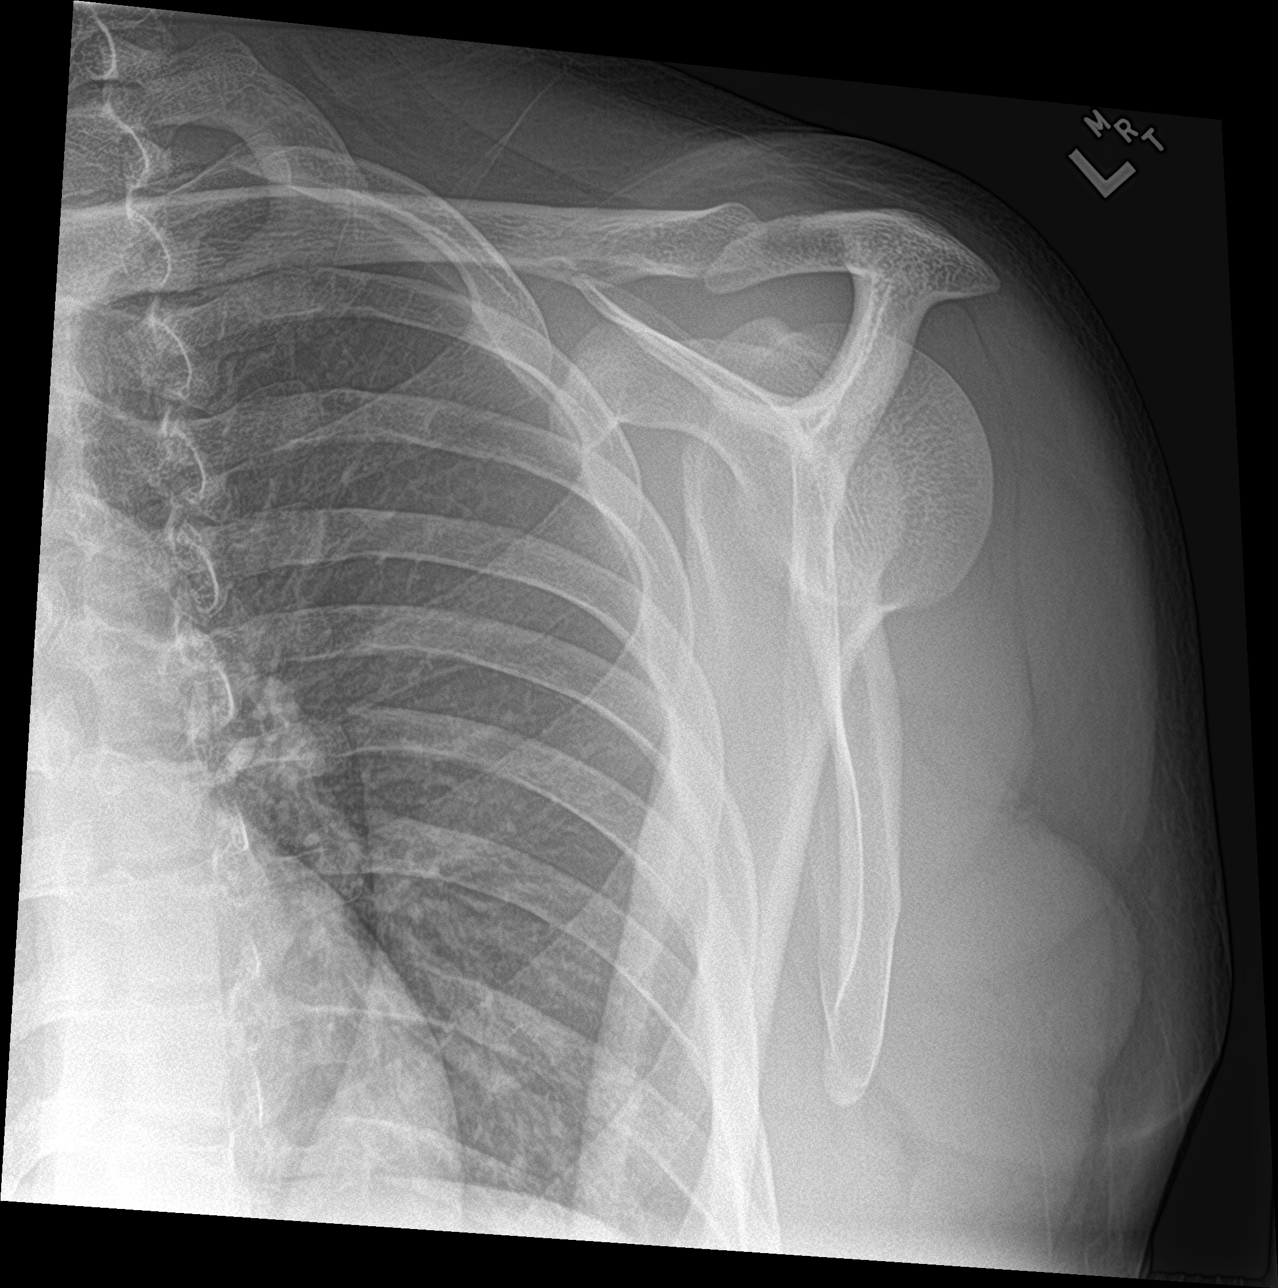

[shoulder axillary]
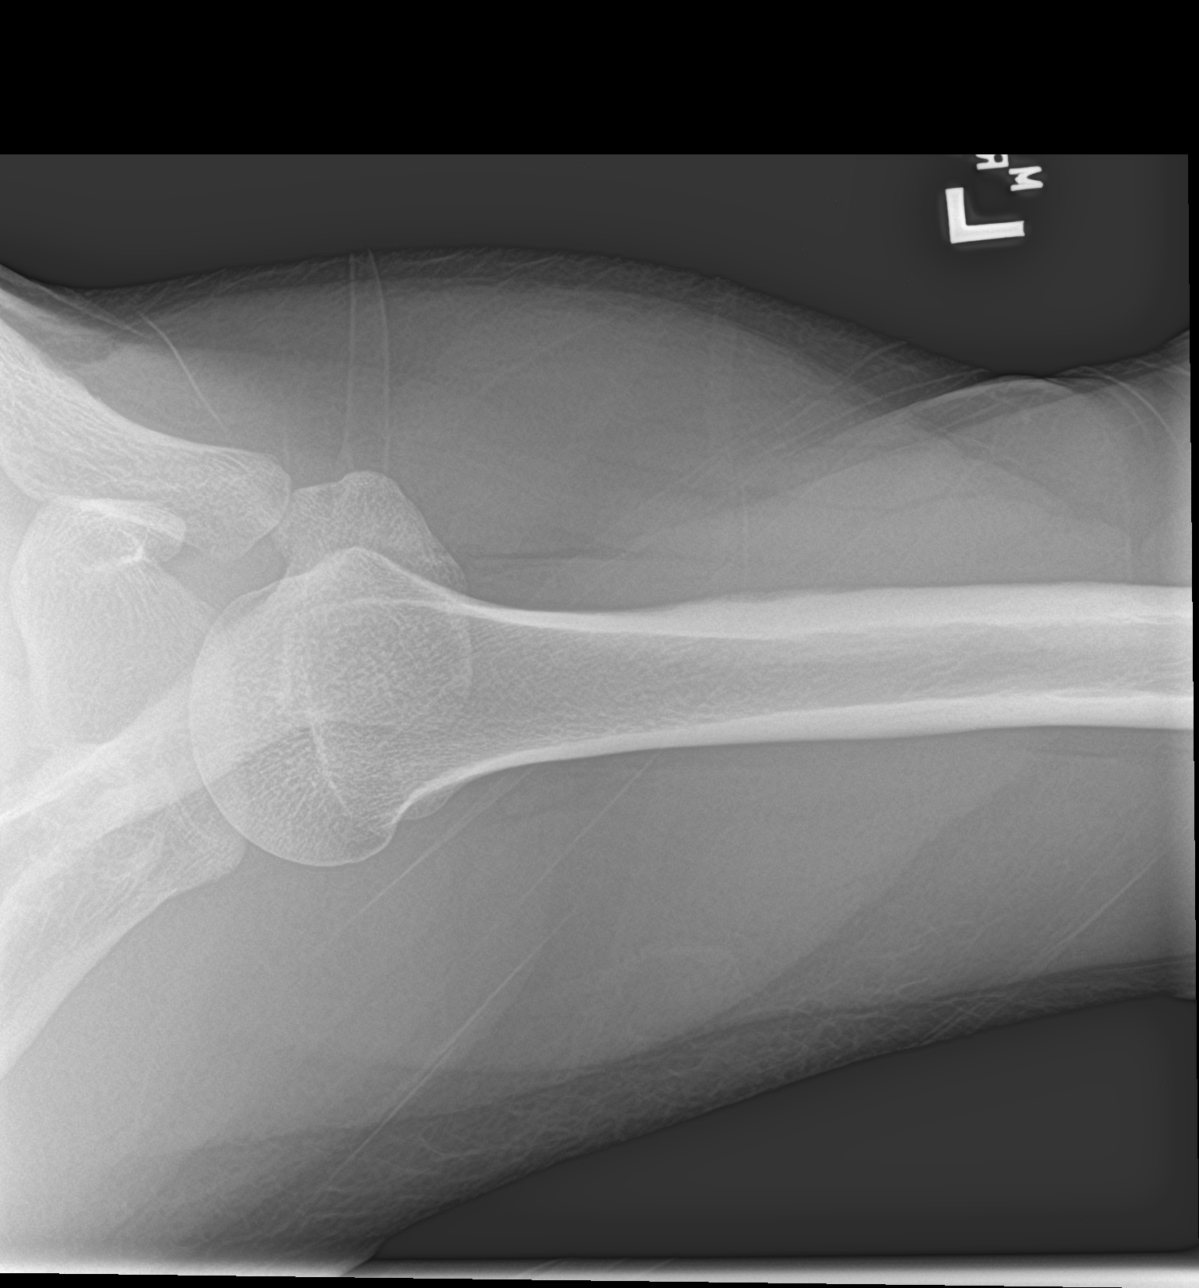

[shoulder grashey]
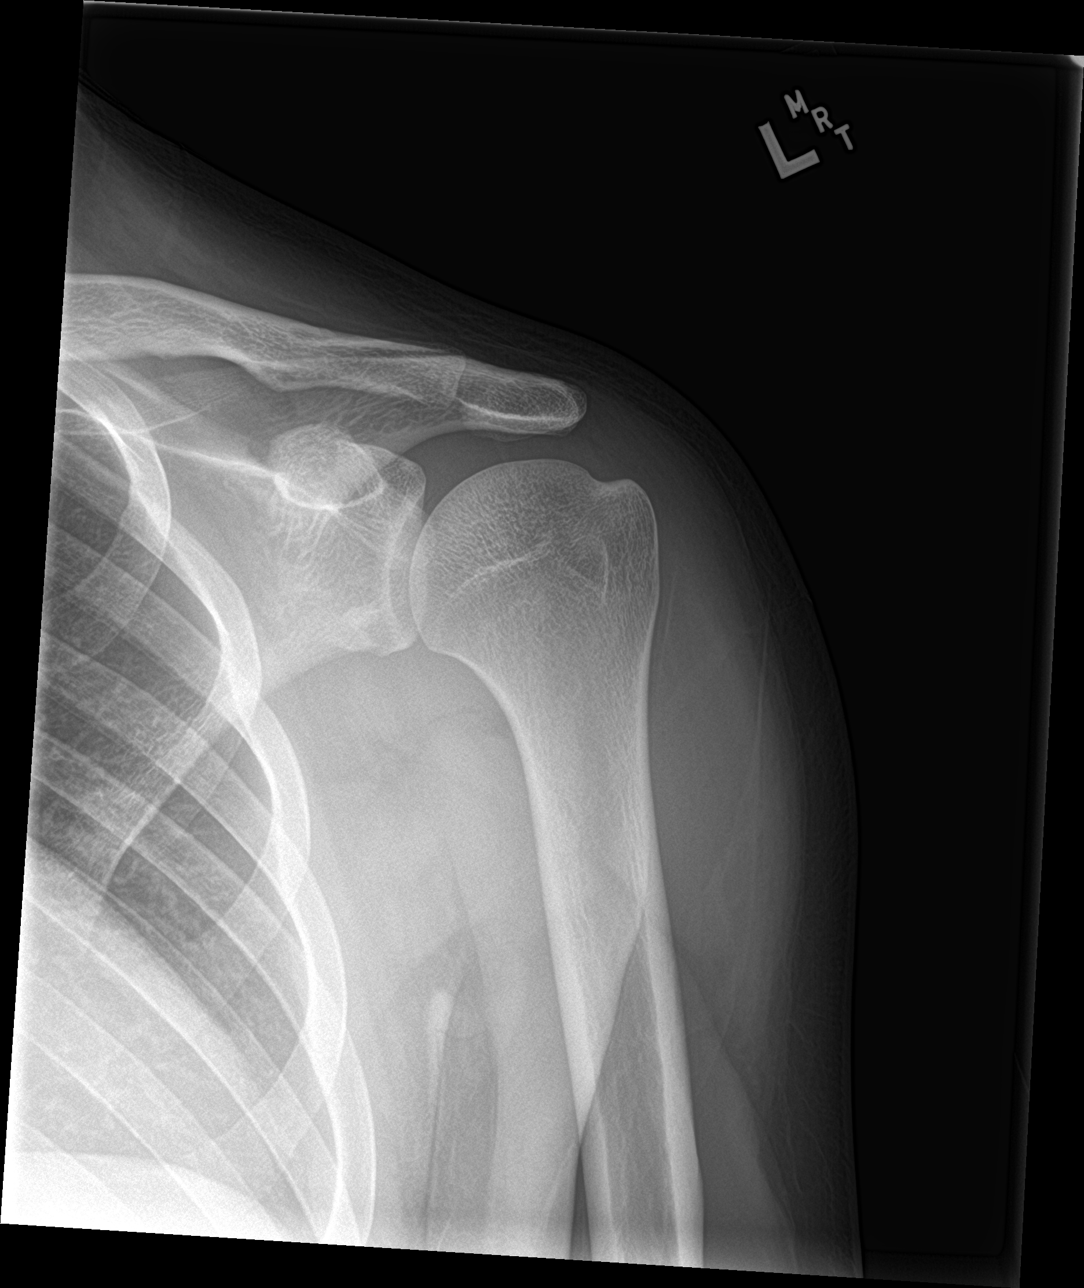

[3 of 3 positions shown; findings below may reference images not displayed]

FINDINGS: There is no evidence of fracture or dislocation. There is no
evidence of arthropathy or other focal bone abnormality. Soft
tissues are unremarkable.
IMPRESSION: Negative.
